# Patient Record
Sex: Male | Born: 1985 | Race: White | Hispanic: No | Marital: Single | Smoking: Never smoker
Health system: Southern US, Community
[De-identification: ages and names within clinical notes are randomized; demographics above are authoritative.]

## PROBLEM LIST (undated history)

## (undated) DIAGNOSIS — F988 Other specified behavioral and emotional disorders with onset usually occurring in childhood and adolescence: Secondary | ICD-10-CM

---

## 2018-06-30 ENCOUNTER — Ambulatory Visit (INDEPENDENT_AMBULATORY_CARE_PROVIDER_SITE_OTHER): Payer: BLUE CROSS/BLUE SHIELD

## 2018-06-30 ENCOUNTER — Ambulatory Visit
Admission: EM | Admit: 2018-06-30 | Discharge: 2018-06-30 | Disposition: A | Discharge status: Home / Self Care | Payer: BLUE CROSS/BLUE SHIELD | Attending: Internal Medicine | Admitting: Internal Medicine

## 2018-06-30 DIAGNOSIS — Y9367 Activity, basketball: Secondary | ICD-10-CM | POA: Diagnosis not present

## 2018-06-30 DIAGNOSIS — X501XXA Overexertion from prolonged static or awkward postures, initial encounter: Secondary | ICD-10-CM | POA: Diagnosis not present

## 2018-06-30 DIAGNOSIS — S93492A Sprain of other ligament of left ankle, initial encounter: Secondary | ICD-10-CM

## 2018-06-30 DIAGNOSIS — S93602A Unspecified sprain of left foot, initial encounter: Secondary | ICD-10-CM

## 2018-06-30 NOTE — Discharge Instructions (Signed)
Apply ice 20 minutes out of every 2 hours 4-5 times daily for comfort.  Elevate your foot and ankle above your heart sufficiently to control swelling and pain.  Use an Ace wrap for support.  If you continue to have discomfort or have difficulty with ambulation follow-up with the podiatrist

## 2018-06-30 NOTE — ED Provider Notes (Signed)
[facility]    CSN: [id number] Arrival date & time: 06/30/18  1756     History   Chief Complaint Chief Complaint  Patient presents with  . Ankle Pain    HPI Donald Hutchinson is a 32 y.o. male.   HPI  32 year old male was playing basketball this morning when he twisted his left ankle into inversion. Pain  Is anterior and also of the midfoot.  He states in the past he has noticed blueing of the foot when he twisted his ankle this time was different.  He states the pain was worse when he elevated and felt like a throbbing pain. Able To bear weight on it only if he walks flat-footed.  Has severe pain with any kind of flexion of the foot or ankle.         History reviewed. No pertinent past medical history.  There are no active problems to display for this patient.   History reviewed. No pertinent surgical history.     Home Medications    Prior to Admission medications   Not on File    Family History No family history on file.  Social History Social History   Tobacco Use  . Smoking status: Never Smoker  . Smokeless tobacco: Never Used  Substance Use Topics  . Alcohol use: Yes  . Drug use: Never     Allergies   Patient has no known allergies.   Review of Systems Review of Systems  Constitutional: Positive for activity change. Negative for appetite change, chills, fatigue and fever.  Musculoskeletal: Positive for arthralgias and gait problem.  All other systems reviewed and are negative.    Physical Exam Triage Vital Signs ED Triage Vitals  Enc Vitals Group     BP 06/30/18 1805 (!) 158/98     Pulse Rate 06/30/18 1805 77     Resp 06/30/18 1805 18     Temp 06/30/18 1805 98.3 F (36.8 C)     Temp Source 06/30/18 1805 Oral     SpO2 06/30/18 1805 100 %     Weight 06/30/18 1808 300 lb (136.1 kg)     Height --      Head Circumference --      Peak Flow --      Pain Score 06/30/18 1808 7     Pain Loc --      Pain Edu? --      Excl. in  GC? --    No data found.  Updated Vital Signs BP (!) 158/98 (BP Location: Right Arm)   Pulse 77   Temp 98.3 F (36.8 C) (Oral)   Resp 18   Wt 300 lb (136.1 kg)   SpO2 100%   Visual Acuity Right Eye Distance:   Left Eye Distance:   Bilateral Distance:    Right Eye Near:   Left Eye Near:    Bilateral Near:     Physical Exam  Constitutional: He is oriented to person, place, and time. He appears well-developed and well-nourished. No distress.  HENT:  Head: Normocephalic.  Eyes: Pupils are equal, round, and reactive to light.  Neck: Normal range of motion.  Musculoskeletal: He exhibits edema and tenderness.  Examination of the left foot and ankle shows swelling of the left foot.  There is no ecchymosis present.  She does not have any pain with compression of the distal fibula and tibia.  No tenderness of the medial malleolus.  He has mild tenderness over the inferior portion of  the lateral malleolus.  Does have tenderness to palpation over the anterior fibulotalar ligament area.  Tenderness however is maximal over the proximal row of tarsals first through approximately the fourth.  No forefoot tenderness. Range of motion is limited by discomfort.  Neurological: He is alert and oriented to person, place, and time.  Skin: Skin is warm and dry. He is not diaphoretic.  Psychiatric: He has a normal mood and affect. His behavior is normal. Judgment and thought content normal.  Nursing note and vitals reviewed.    UC Treatments / Results  Labs (all labs ordered are listed, but only abnormal results are displayed) Labs Reviewed - No data to display  EKG None  Radiology Dg Ankle Complete Left  Result Date: 06/30/2018 CLINICAL DATA:  Trauma to the left ankle today with pain. EXAM: LEFT ANKLE COMPLETE - 3+ VIEW COMPARISON:  None. FINDINGS: There is no evidence of fracture, dislocation, or joint effusion. There is no evidence of arthropathy or other focal bone abnormality. Soft tissues  are unremarkable. IMPRESSION: Negative. Electronically Signed   By: Sherian Rein M.D.   On: 06/30/2018 19:01   Dg Foot Complete Left  Result Date: 06/30/2018 CLINICAL DATA:  Twisted left foot and ankle with pain. EXAM: LEFT FOOT - COMPLETE 3+ VIEW COMPARISON:  None. FINDINGS: There is no evidence of fracture or dislocation. There is no evidence of arthropathy or other focal bone abnormality. Soft tissues are unremarkable. IMPRESSION: Negative. Electronically Signed   By: Sherian Rein M.D.   On: 06/30/2018 19:01    Procedures Procedures (including critical care time)  Medications Ordered in UC Medications - No data to display  Initial Impression / Assessment and Plan / UC Course  I have reviewed the triage vital signs and the nursing notes.  Pertinent labs & imaging results that were available during my care of the patient were reviewed by me and considered in my medical decision making (see chart for details).     Reviewed the x-rays and my physical findings with the patient.  Though there is no bony injury I suspect that he has ligamentous damage to the foot.  Possibly represent a Lisfranc injury.  I have offered the patient a boot orthosis also posterior ankle stirrup but he has declined both.  I then offered him an Ace wrap which he also declined and stated he would buy his own.  He is using a walking stick for ambulation.  I reviewed with him adequate elevation and the use of rest ice and elevation.  Does work from home and is able to protect his foot.  He is not improving I recommended that he follow-up with a orthopedist or a podiatrist.  He can use  Motrin or Tylenol or combination of both for pain control. Final Clinical Impressions(s) / UC Diagnoses   Final diagnoses:  Sprain of anterior talofibular ligament of left ankle, initial encounter  Sprain of left foot, initial encounter     Discharge Instructions     Apply ice 20 minutes out of every 2 hours 4-5 times daily for  comfort.  Elevate your foot and ankle above your heart sufficiently to control swelling and pain.  Use an Ace wrap for support.  If you continue to have discomfort or have difficulty with ambulation follow-up with the podiatrist    ED Prescriptions    None     Controlled Substance Prescriptions [place] Controlled Substance Registry consulted? No   Lutricia Feil, [place] 06/30/18 1610

## 2018-06-30 NOTE — ED Triage Notes (Signed)
Pt did twist his left ankle today and states it was fine until he tried to elevate it and that seems to make the pain worse and feels like a "throbbing pain" Did take motrin earlier today but that did not help the pain. Is swelling and redness located in the left ankle.

## 2019-05-27 ENCOUNTER — Ambulatory Visit
Admission: EM | Admit: 2019-05-27 | Discharge: 2019-05-27 | Disposition: A | Discharge status: Home / Self Care | Payer: BC Managed Care – PPO | Attending: Emergency Medicine | Admitting: Emergency Medicine

## 2019-05-27 ENCOUNTER — Other Ambulatory Visit: Payer: Self-pay

## 2019-05-27 DIAGNOSIS — S81811A Laceration without foreign body, right lower leg, initial encounter: Secondary | ICD-10-CM | POA: Diagnosis not present

## 2019-05-27 DIAGNOSIS — Z23 Encounter for immunization: Secondary | ICD-10-CM | POA: Diagnosis not present

## 2019-05-27 DIAGNOSIS — W268XXA Contact with other sharp object(s), not elsewhere classified, initial encounter: Secondary | ICD-10-CM

## 2019-05-27 HISTORY — DX: Other specified behavioral and emotional disorders with onset usually occurring in childhood and adolescence: F98.8

## 2019-05-27 MED ORDER — TETANUS-DIPHTH-ACELL PERTUSSIS 5-2.5-18.5 LF-MCG/0.5 IM SUSP
0.5000 mL | Freq: Once | INTRAMUSCULAR | Status: AC
  Administered 2019-05-27: 0.5 mL via INTRAMUSCULAR

## 2019-05-27 MED ORDER — LIDOCAINE HCL (PF) 1 % IJ SOLN
5.0000 mL | Freq: Once | INTRAMUSCULAR | Status: AC
  Administered 2019-05-27: 5 mL

## 2019-05-27 NOTE — Discharge Instructions (Signed)
Keep your leg as straight as possible for 24 hours. Keep it covered during that time as well.   After the 24 hours, keep it uncovered unless there is a chance it may get dirty. Do not use any ointments. You can shower. Just make sure you pat it dry after the shower.   Return if it starts to show any signs of infection or if you believe a stitch is missing (you have a total of 8 stiches).   Follow-up in 10-14 days for reevaluation and stitch removal.

## 2019-05-27 NOTE — ED Provider Notes (Signed)
[facility] [place]   Name: Donald Hutchinson DOB: 11/08/1985 MRN: [id number] CSN: [id number] PCP: Patient, No Pcp Per  Arrival date and time:  05/27/19 1508  Chief Complaint:  Extremity Laceration   NOTE: Prior to seeing the patient today, I have reviewed the triage nursing documentation and vital signs. Clinical staff has updated patient's PMH/PSHx, current medication list, and drug allergies/intolerances to ensure comprehensive history available to assist in medical decision making.   History:   HPI: Donald Hutchinson is a 33 y.o. male who presents today with complaints of a right leg/knee laceration. Pt jumped off his tractor and cut his right knee of a metal table. It occurred two hours before presenting to [facility] [place] [facility]. Pt and wife attempted to care for wound at home with steri-strips and quick clot at home, but area continued to bleed. No previous laceration that required sutures.    Past Medical History:  Diagnosis Date  . ADD (attention deficit disorder)     History reviewed. No pertinent surgical history.  Family History  Problem Relation Age of Onset  . Healthy Mother   . Healthy Father     Social History   Tobacco Use  . Smoking status: Never Smoker  . Smokeless tobacco: Never Used  Substance Use Topics  . Alcohol use: Yes  . Drug use: Never    There are no active problems to display for this patient.   Home Medications:    Current Meds  Medication Sig  . amphetamine-dextroamphetamine (ADDERALL) 30 MG tablet     Allergies:   Patient has no known allergies.  Review of Systems (ROS): Review of Systems  Skin: Positive for wound.       laceration  All other systems reviewed and are negative.    Vital Signs: Today's Vitals   05/27/19 1532 05/27/19 1533  BP:  (!) 139/103  Pulse:  92  Resp:  18  Temp:  98.2 F (36.8 C)  TempSrc:  Oral  SpO2:  98%  Weight: (!) 330 lb (149.7 kg)   PainSc: 3      Physical Exam: Physical Exam  Vitals signs and nursing note reviewed.  Constitutional:      Appearance: He is well-developed.  HENT:     Head: Normocephalic and atraumatic.  Eyes:     Conjunctiva/sclera: Conjunctivae normal.  Neck:     Musculoskeletal: Neck supple.  Cardiovascular:     Rate and Rhythm: Normal rate and regular rhythm.     Heart sounds: No murmur.  Pulmonary:     Effort: Pulmonary effort is normal. No respiratory distress.     Breath sounds: Normal breath sounds.  Abdominal:     Palpations: Abdomen is soft.     Tenderness: There is no abdominal tenderness.  Skin:    General: Skin is warm and dry.       Neurological:     Mental Status: He is alert.     Urgent Care Treatments / Results:   LABS: PLEASE NOTE: all labs that were ordered this encounter are listed, however only abnormal results are displayed. Labs Reviewed - No data to display  EKG: -None  RADIOLOGY: No results found.  PROCEDURES: Laceration Repair  Date/Time: 05/27/2019 4:26 PM Performed by: Bailey MechBenjamin, Tyrianna Lightle, NP Authorized by: Bailey MechBenjamin, Desare Duddy, NP   Consent:    Consent obtained:  Verbal   Consent given by:  Patient   Risks discussed:  Infection, need for additional repair, pain, poor cosmetic result and  poor wound healing   Alternatives discussed:  No treatment and delayed treatment Universal protocol:    Procedure explained and questions answered to patient or proxy's satisfaction: yes     Relevant documents present and verified: yes     Test results available and properly labeled: yes     Imaging studies available: yes     Required blood products, implants, devices, and special equipment available: yes     Site/side marked: yes     Immediately prior to procedure, a time out was called: yes     Patient identity confirmed:  Verbally with patient Anesthesia (see MAR for exact dosages):    Anesthesia method:  Local infiltration   Local anesthetic:  Lidocaine 1% WITH epi Laceration details:    Location:  Leg    Leg location:  R knee   Length (cm):  6 Repair type:    Repair type:  Simple Pre-procedure details:    Preparation:  Patient was prepped and draped in usual sterile fashion Exploration:    Hemostasis achieved with:  Epinephrine and direct pressure   Wound exploration: wound explored through full range of motion     Wound extent: no foreign bodies/material noted, no muscle damage noted, no nerve damage noted, no tendon damage noted, no underlying fracture noted and no vascular damage noted     Contaminated: no   Treatment:    Area cleansed with:  Shur-Clens   Amount of cleaning:  Extensive   Irrigation solution:  Sterile saline   Irrigation volume:  60 mL   Irrigation method:  Syringe   Visualized foreign bodies/material removed: no   Skin repair:    Repair method:  Sutures   Suture size:  4-0   Suture material:  Nylon   Suture technique:  Simple interrupted Approximation:    Approximation:  Close Post-procedure details:    Dressing:  Antibiotic ointment and non-adherent dressing   Patient tolerance of procedure:  Tolerated well, no immediate complications    MEDICATIONS RECEIVED THIS VISIT: Medications  Tdap (BOOSTRIX) injection 0.5 mL (0.5 mLs Intramuscular Given 05/27/19 1602)  lidocaine (PF) (XYLOCAINE) 1 % injection 5 mL (5 mLs Other Given by Other 05/27/19 1608)    PERTINENT CLINICAL COURSE NOTES/UPDATES:   Initial Impression / Assessment and Plan / Urgent Care Course:  Pertinent labs & imaging results that were available during my care of the patient were personally reviewed by me and considered in my medical decision making (see lab/imaging section of note for values and interpretations).  Donald Hutchinson is a 33 y.o. male who [facility] with complaints of right leg laceration, diagnosed with right leg laceration, and treated as such with the procedure above. NP and patient reviewed discharge instructions below during visit.   Patient is well  appearing overall in clinic today. He does not appear to be in any acute distress. Presenting symptoms (see HPI) and exam as documented above.   I have reviewed the follow up and strict return precautions for any new or worsening symptoms. Patient is aware of symptoms that would be deemed urgent/emergent, and would thus require further evaluation either here or in the emergency department. At the time of discharge, he verbalized understanding and consent with the discharge plan as it was reviewed with him. All questions were fielded by provider and/or clinic staff prior to patient discharge.    Final Clinical Impressions / Urgent Care Diagnoses:   Final diagnoses:  Laceration of right lower extremity, initial  encounter    New Prescriptions:  [place] Controlled Substance Registry consulted? Not Applicable  Meds ordered this encounter  Medications  . Tdap (BOOSTRIX) injection 0.5 mL  . lidocaine (PF) (XYLOCAINE) 1 % injection 5 mL      Discharge Instructions     Keep your leg as straight as possible for 24 hours. Keep it covered during that time as well.   After the 24 hours, keep it uncovered unless there is a chance it may get dirty. Do not use any ointments. You can shower. Just make sure you pat it dry after the shower.   Return if it starts to show any signs of infection or if you believe a stitch is missing (you have a total of 8 stiches).   Follow-up in 10-14 days for reevaluation and stitch removal.     Recommended Follow up Care:  Patient encouraged to follow up with the above provider within the specified time frame, or sooner as dictated by the severity of his symptoms. As always, he was instructed that for any urgent/emergent care needs, he should seek care either here or in the emergency department for more immediate evaluation.   Gertie Baron, DNP, NP-c    Gertie Baron, NP 05/27/19 1630

## 2019-05-27 NOTE — ED Triage Notes (Signed)
Pt states he was jumping off his tractor and cut his right knee on a metal table. This happened 2 hours ago. Does have a pressure dressing on it. Does need a tdap.

## 2019-06-06 ENCOUNTER — Encounter: Payer: Self-pay | Admitting: Emergency Medicine

## 2019-06-06 ENCOUNTER — Ambulatory Visit
Admission: EM | Admit: 2019-06-06 | Discharge: 2019-06-06 | Disposition: A | Discharge status: Home / Self Care | Payer: BC Managed Care – PPO

## 2019-06-06 ENCOUNTER — Other Ambulatory Visit: Payer: Self-pay

## 2019-06-06 NOTE — ED Triage Notes (Signed)
Pt presents to [facility] for suture removal. She had 8 sutures placed on 05/27/19. Removed 8 sutures. Pt tolerated procedure well. Wound healing well.

## 2019-11-04 IMAGING — CR DG FOOT COMPLETE 3+V*L*
3 series · 3 of 3 positions shown · non-contrast
Comparison: None.

CLINICAL DATA: Twisted left foot and ankle with pain.

EXAM:
LEFT FOOT - COMPLETE 3+ VIEW

[foot ap]
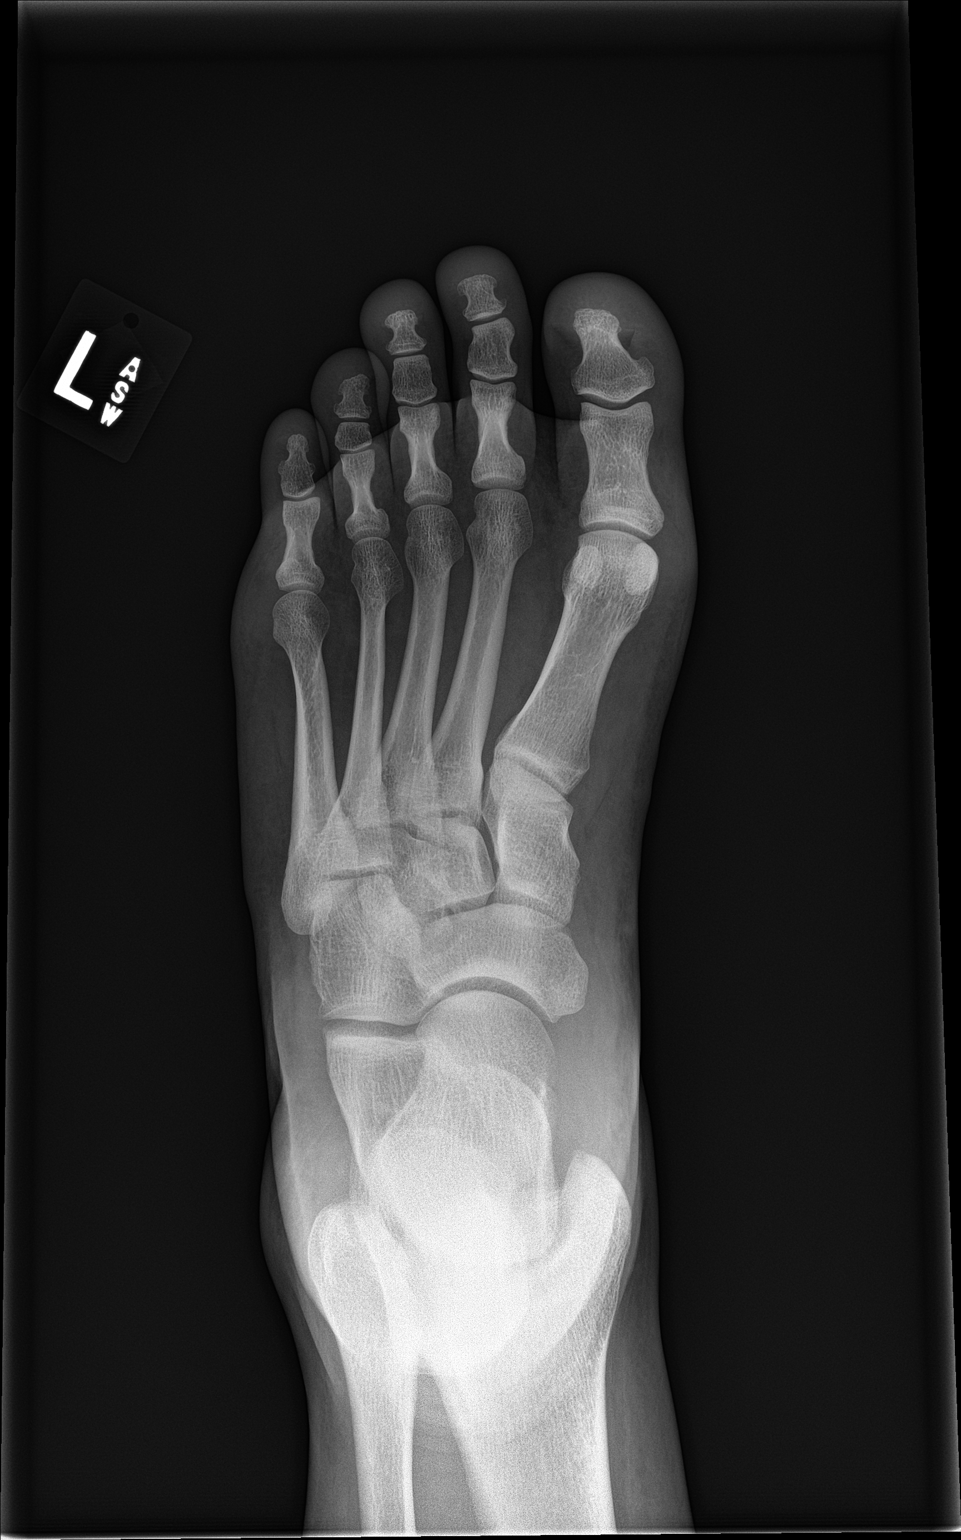

[foot obl]
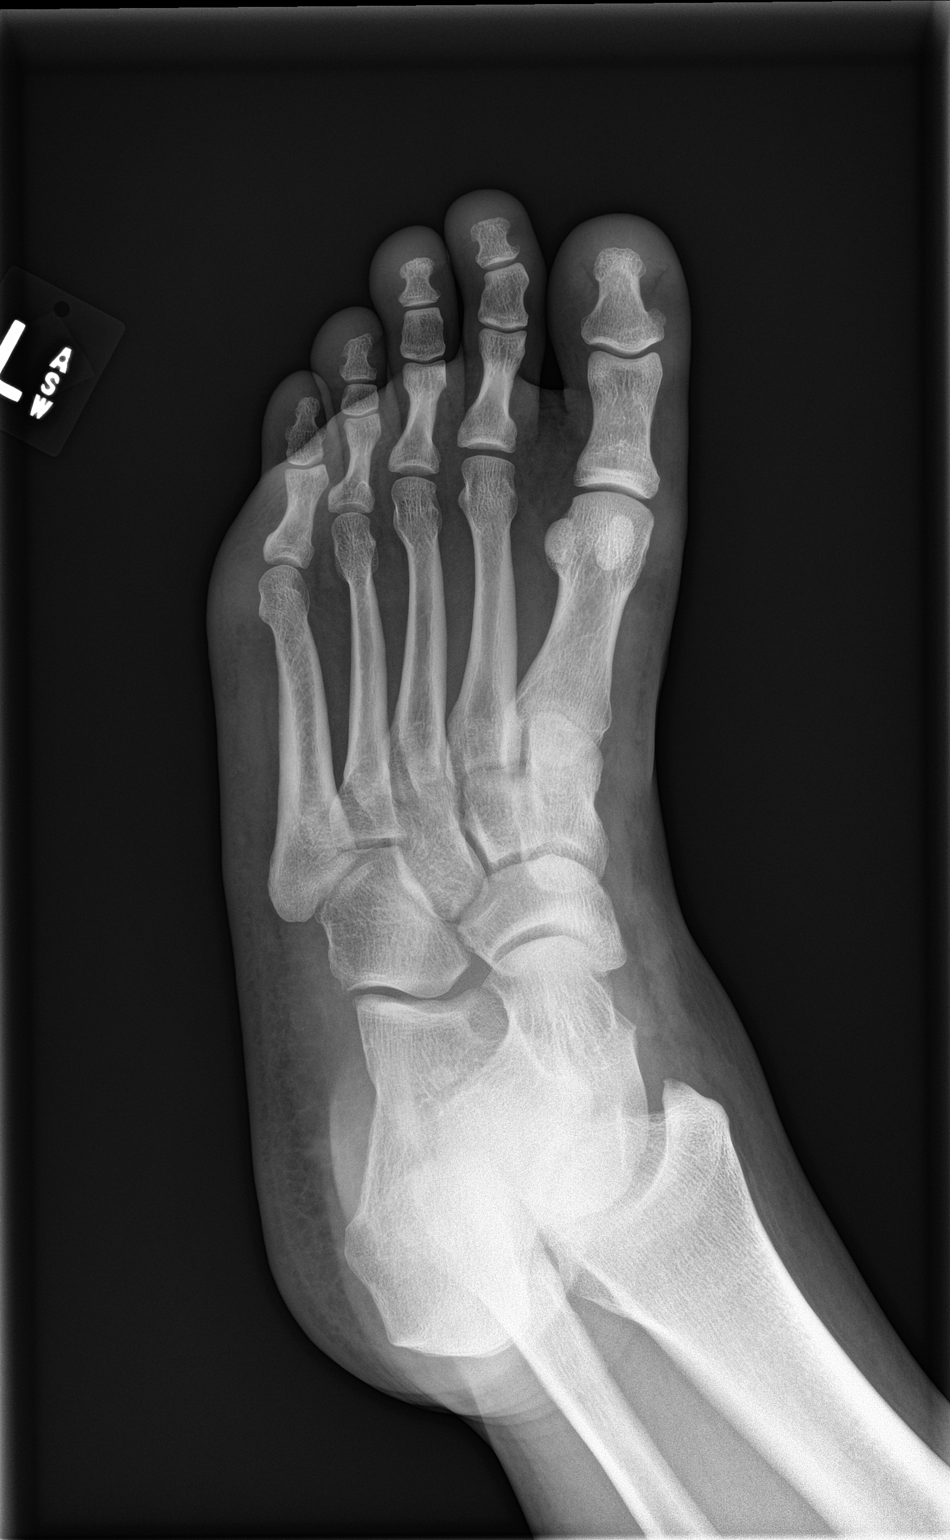

[foot lat]
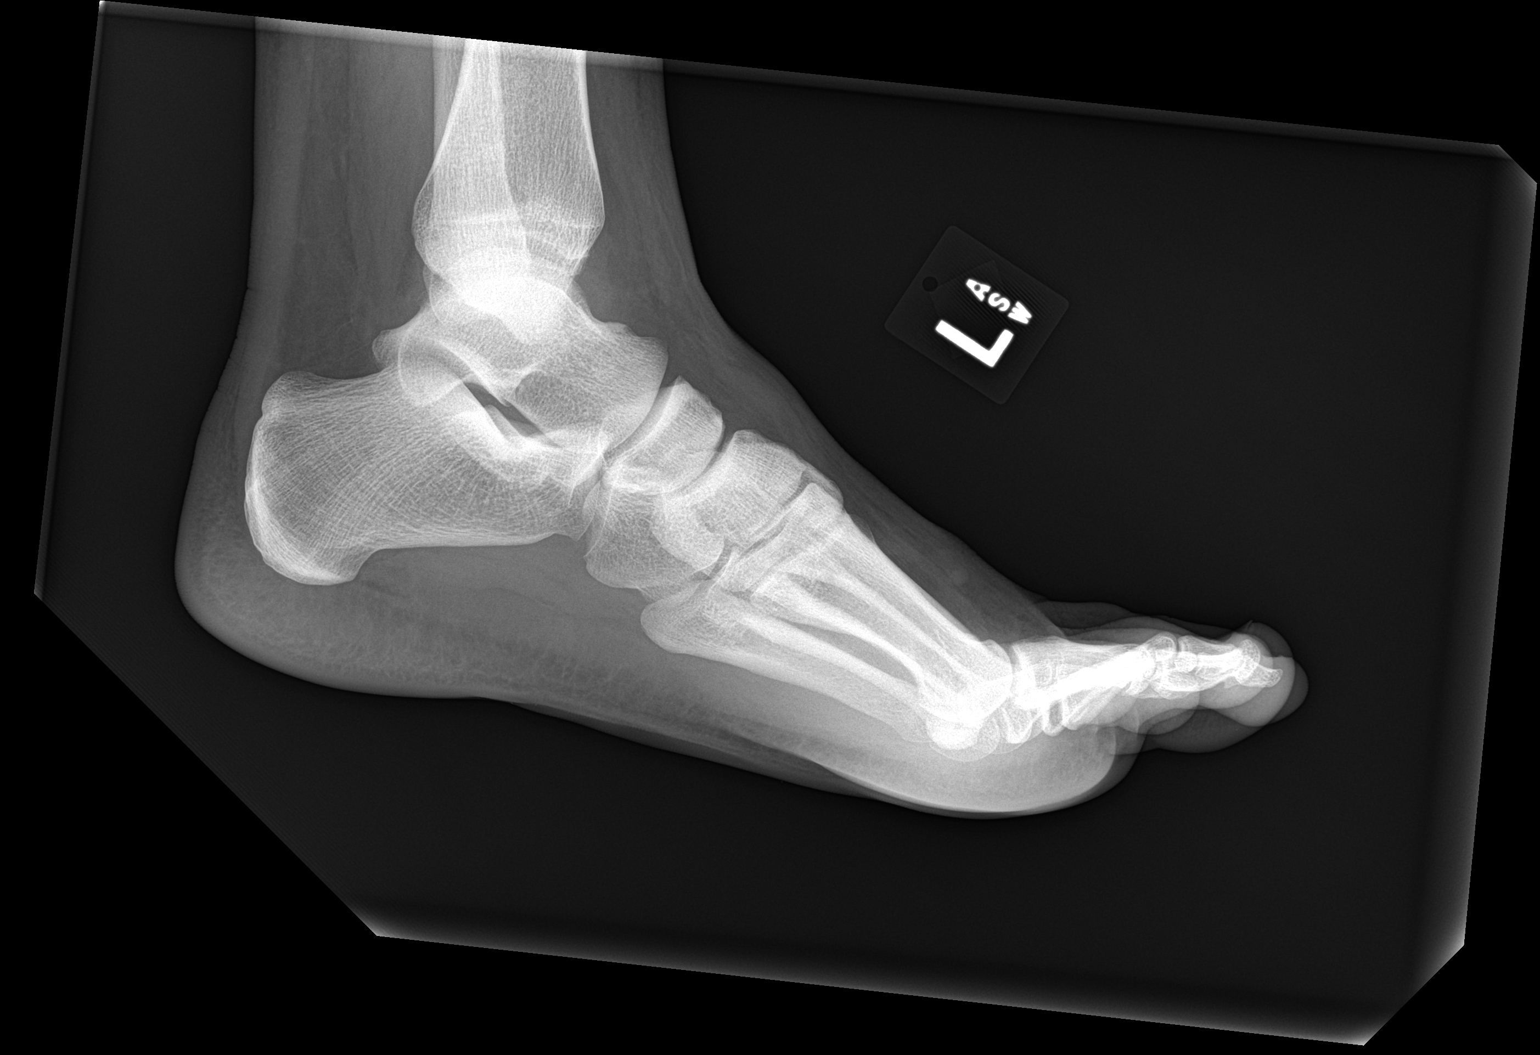

[3 of 3 positions shown; findings below may reference images not displayed]

FINDINGS: There is no evidence of fracture or dislocation. There is no
evidence of arthropathy or other focal bone abnormality. Soft
tissues are unremarkable.
IMPRESSION: Negative.
